# Patient Record
Sex: Male | Born: 1970 | Race: White | Hispanic: No | Marital: Married
Health system: Southern US, Community
[De-identification: ages and names within clinical notes are randomized; demographics above are authoritative.]

---

## 2001-02-11 ENCOUNTER — Emergency Department (HOSPITAL_COMMUNITY): Admission: EM | Admit: 2001-02-11 | Discharge: 2001-02-12 | Payer: Self-pay | Admitting: *Deleted

## 2004-11-26 ENCOUNTER — Observation Stay (HOSPITAL_COMMUNITY): Admission: EM | Admit: 2004-11-26 | Discharge: 2004-11-27 | Payer: Self-pay | Admitting: *Deleted

## 2007-06-04 ENCOUNTER — Emergency Department (HOSPITAL_COMMUNITY): Admission: EM | Admit: 2007-06-04 | Discharge: 2007-06-04 | Payer: Self-pay | Admitting: Emergency Medicine

## 2010-09-07 NOTE — Op Note (Signed)
NAME:  Alexander Marquez, Alexander Marquez NO.:  [id number]   MEDICAL RECORD NO.:  [id number]          PATIENT TYPE:  EMS   LOCATION:  MAJO                         FACILITY:  [facility]   PHYSICIAN:  Mark C. Ophelia Charter, M.D.    DATE OF BIRTH:  [birth year]   DATE OF PROCEDURE:  11/26/2004  DATE OF DISCHARGE:                                 OPERATIVE REPORT   PREOPERATIVE DIAGNOSIS:  Right distal radius fracture angulated and  displaced.   POSTOPERATIVE DIAGNOSIS:  Right distal radius fracture angulated and  displaced.   OPERATION PERFORMED:  Open reduction internal fixation of distal radius  fracture.   SURGEON:  Mark C. Ophelia Charter, M.D.   ANESTHESIA:  General orotracheal anesthesia plus local.   ESTIMATED BLOOD LOSS:  Minimal.   TOURNIQUET TIME:  47 minutes.   DESCRIPTION OF PROCEDURE:  After induction of general anesthesia,  orotracheal intubation, preoperative Ancef prophylaxis, standard prepping  and draping was performed.  Arm was wrapped with an Esmarch.  Tourniquet was  inflated.  Sterile skin marker was used.  Incision was made between the  flexor carpi radialis and radial artery. Pronator was elevated from the  radial side toward the ulnar aspect.  Fracture was significantly displaced  and required reduction and use of the Key elevator to lever and pry to get  the fragment reduced despite distraction.  Once it was reduced, a right  volar hand [employer] plate DVR was selected.  Initial screw on the radius  was placed.  While the plate was checked, it was slightly toward the ulnar  aspect and the plate was then moved slightly more toward the radial where it  was in the midline of the bone.  A 14 mm screw placed and then the length  adjusted.  Distally the pegs were filled except for the ulnar most PEG.  AP  and lateral fluoroscopic pictures were taken showing anatomic position.  After irrigation with saline solution, with placement of six pegs, the wound  was irrigated and then one  final proximal 14 mm bicortical screw was placed.  Spot fluoroscopy permanent pictures taken.  Subcutaneous tissue  reapproximated with 3-0 Vicryl, 4-0 Vicryl skin closure.  Marcaine  infiltration of the skin, hand dressing, __________ dorsal splint and Ace  wrap.  Instrument and needle counts were correct.  The patient was  transferred to recovery room in stable condition.       MCY/MEDQ  D:  11/26/2004  T:  11/27/2004  Job:  [id number]

## 2011-01-11 LAB — URINALYSIS, ROUTINE W REFLEX MICROSCOPIC
Glucose, UA: NEGATIVE
Leukocytes, UA: NEGATIVE
Nitrite: NEGATIVE
Urobilinogen, UA: 1

## 2011-01-11 LAB — COMPREHENSIVE METABOLIC PANEL
ALT: 37
AST: 35
Alkaline Phosphatase: 73
BUN: 9
CO2: 27
Calcium: 8.7
GFR calc non Af Amer: >60
Sodium: 139
Total Protein: 6.3

## 2011-01-11 LAB — CBC
HCT: 40.5
Hemoglobin: 14.3

## 2011-01-11 LAB — DIFFERENTIAL
Eosinophils Absolute: 0.1
Monocytes Relative: 1 — ABNORMAL LOW
Neutrophils Relative %: 96 — ABNORMAL HIGH

## 2011-01-11 LAB — INFLUENZA A+B VIRUS AG-DIRECT(RAPID)
Inflenza A Ag: NEGATIVE
Influenza B Ag: NEGATIVE

## 2011-01-11 LAB — URINE MICROSCOPIC-ADD ON

## 2013-05-17 ENCOUNTER — Ambulatory Visit
Admission: RE | Admit: 2013-05-17 | Discharge: 2013-05-17 | Disposition: A | Discharge status: Home / Self Care | Payer: Worker's Compensation | Source: Ambulatory Visit | Attending: Family | Admitting: Family

## 2013-05-17 ENCOUNTER — Other Ambulatory Visit: Payer: Self-pay | Admitting: Family

## 2013-05-17 DIAGNOSIS — R52 Pain, unspecified: Secondary | ICD-10-CM

## 2015-08-30 IMAGING — CR DG KNEE COMPLETE 4+V*L*
4 series · 4 of 4 positions shown · non-contrast
Comparison: None.

CLINICAL DATA: Pain

EXAM:
LEFT KNEE - COMPLETE 4+ VIEW

[view not recorded (1 of 4)]
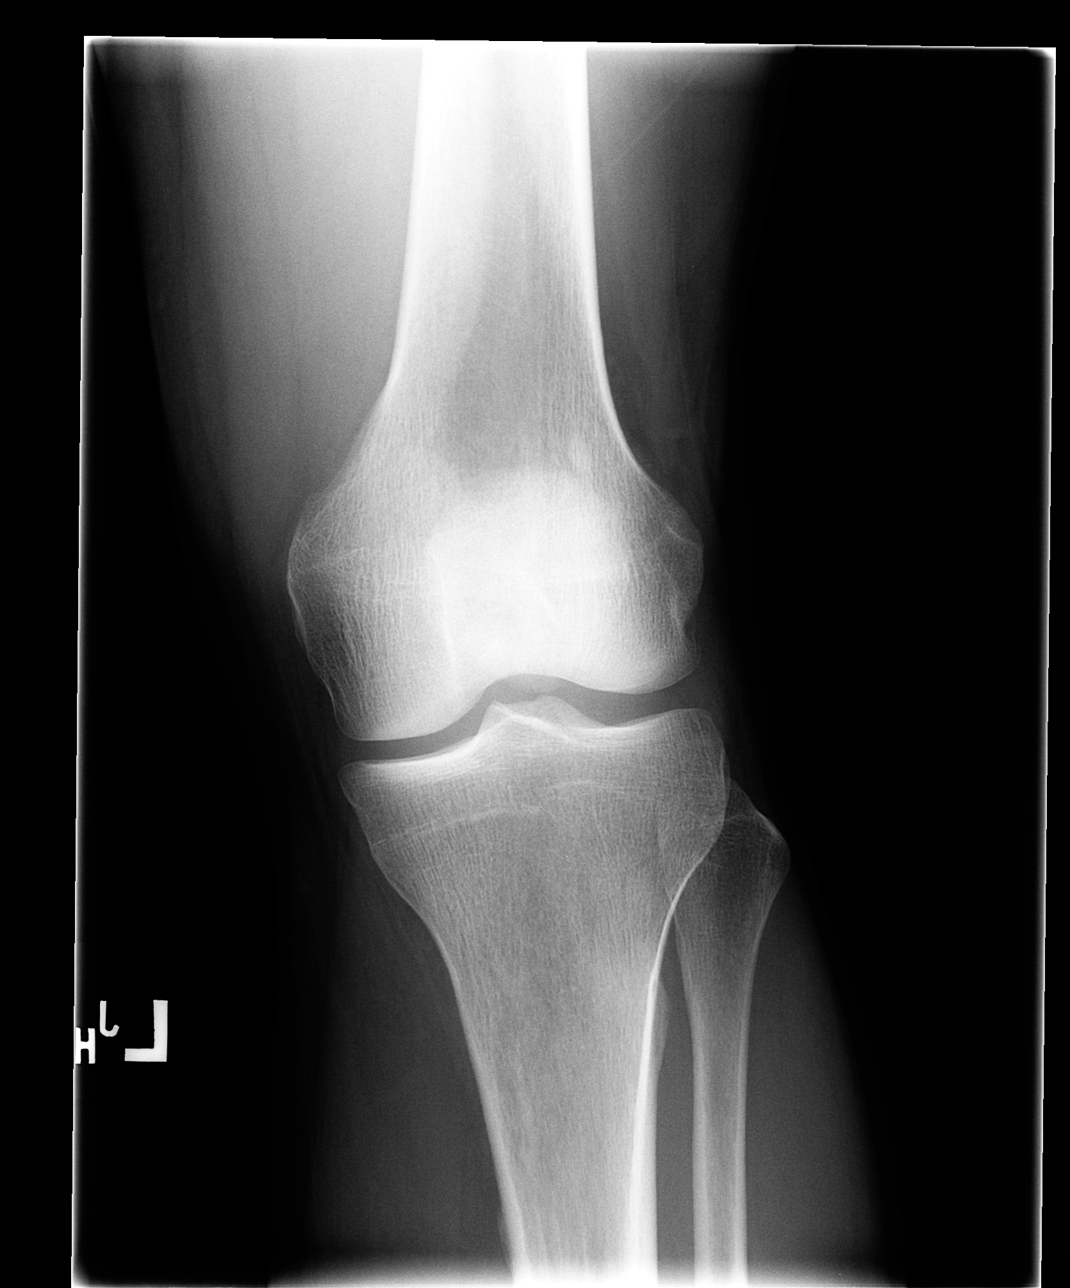

[view not recorded (2 of 4)]
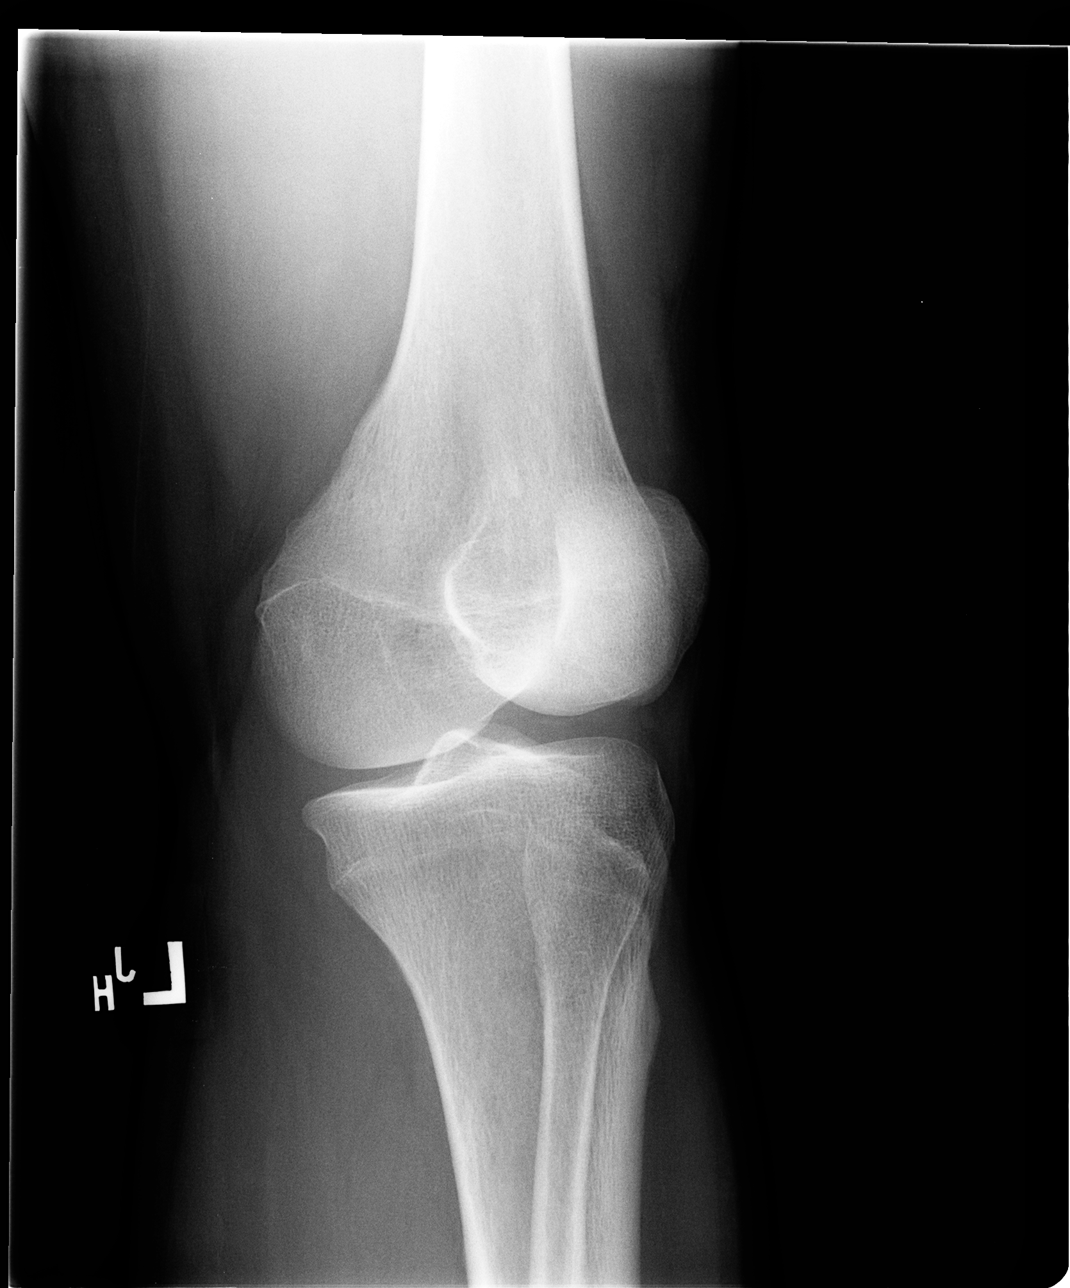

[view not recorded (3 of 4)]
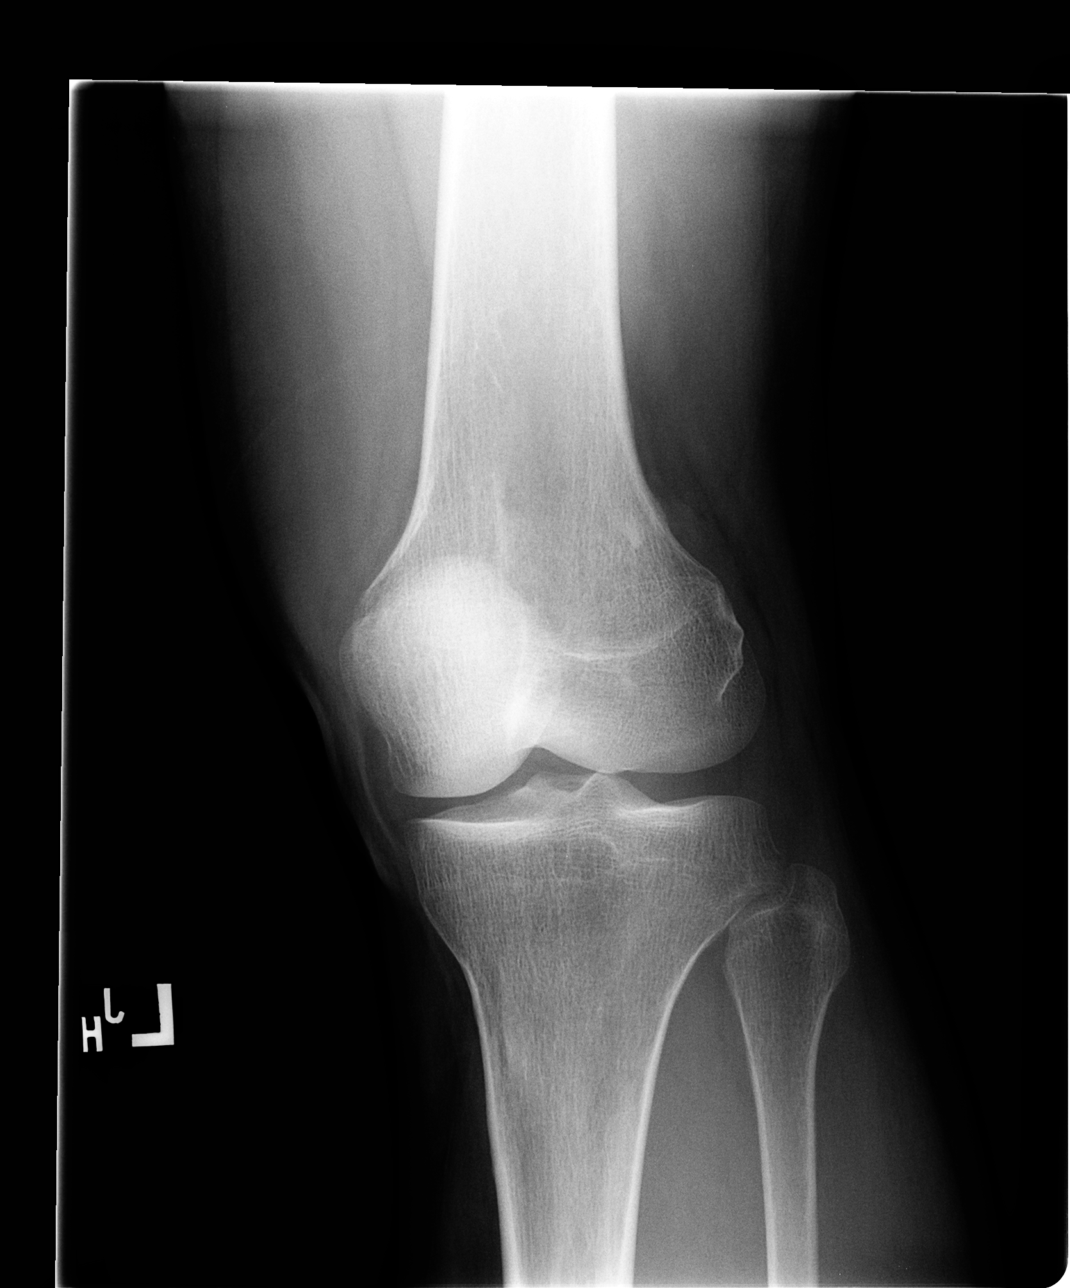

[view not recorded (4 of 4)]
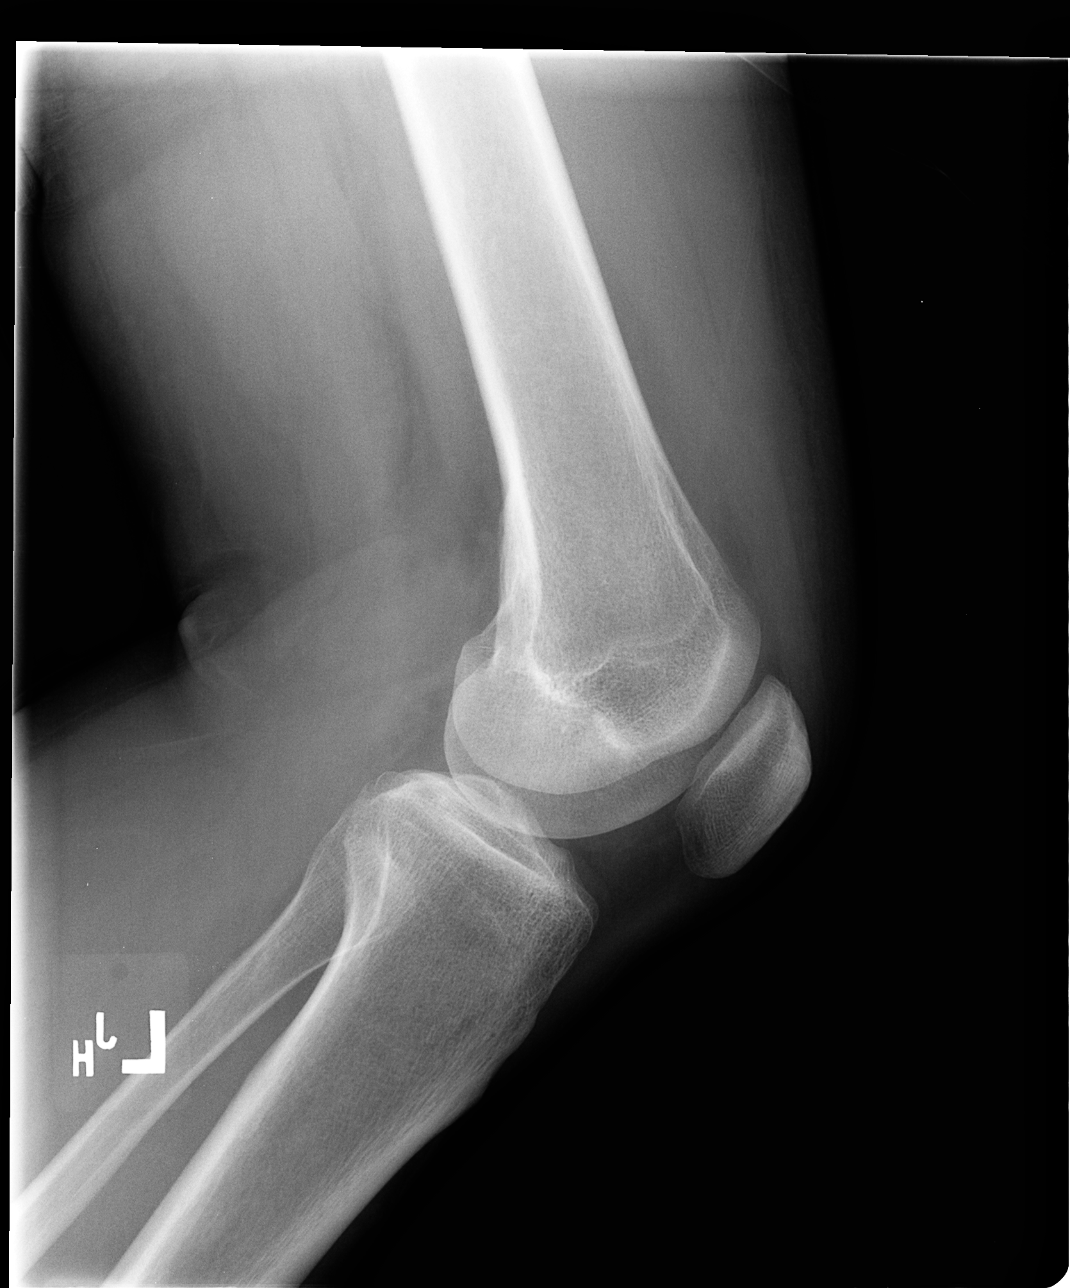

[4 of 4 positions shown; findings below may reference images not displayed]

FINDINGS: There is no evidence of fracture, dislocation, or joint effusion.
There is no evidence of arthropathy or other focal bone abnormality.
Soft tissues are unremarkable.
IMPRESSION: Negative.
